# Patient Record
Sex: Female | Born: 1995 | Race: White | Hispanic: No | Marital: Single | Smoking: Never smoker
Health system: Southern US, Community
[De-identification: ages and names within clinical notes are randomized; demographics above are authoritative.]

## PROBLEM LIST (undated history)

## (undated) DIAGNOSIS — I471 Supraventricular tachycardia, unspecified: Secondary | ICD-10-CM

## (undated) DIAGNOSIS — J45909 Unspecified asthma, uncomplicated: Secondary | ICD-10-CM

## (undated) DIAGNOSIS — R569 Unspecified convulsions: Secondary | ICD-10-CM

## (undated) HISTORY — PX: WISDOM TOOTH EXTRACTION: SHX21

## (undated) HISTORY — PX: MYRINGOTOMY WITH TUBE PLACEMENT: SHX5663

## (undated) HISTORY — PX: TONSILLECTOMY: SUR1361

## (undated) HISTORY — PX: LOOP RECORDER IMPLANT: SHX5954

---

## 2016-02-16 ENCOUNTER — Emergency Department (HOSPITAL_BASED_OUTPATIENT_CLINIC_OR_DEPARTMENT_OTHER)
Admission: EM | Admit: 2016-02-16 | Discharge: 2016-02-16 | Disposition: A | Discharge status: Home / Self Care | Payer: Medicaid Other | Attending: Emergency Medicine | Admitting: Emergency Medicine

## 2016-02-16 ENCOUNTER — Emergency Department (HOSPITAL_BASED_OUTPATIENT_CLINIC_OR_DEPARTMENT_OTHER): Payer: Medicaid Other

## 2016-02-16 ENCOUNTER — Encounter (HOSPITAL_BASED_OUTPATIENT_CLINIC_OR_DEPARTMENT_OTHER): Payer: Self-pay

## 2016-02-16 DIAGNOSIS — Y939 Activity, unspecified: Secondary | ICD-10-CM | POA: Insufficient documentation

## 2016-02-16 DIAGNOSIS — Z79899 Other long term (current) drug therapy: Secondary | ICD-10-CM | POA: Insufficient documentation

## 2016-02-16 DIAGNOSIS — W228XXA Striking against or struck by other objects, initial encounter: Secondary | ICD-10-CM | POA: Diagnosis not present

## 2016-02-16 DIAGNOSIS — Y929 Unspecified place or not applicable: Secondary | ICD-10-CM | POA: Insufficient documentation

## 2016-02-16 DIAGNOSIS — Y990 Civilian activity done for income or pay: Secondary | ICD-10-CM | POA: Diagnosis not present

## 2016-02-16 DIAGNOSIS — S6991XA Unspecified injury of right wrist, hand and finger(s), initial encounter: Secondary | ICD-10-CM

## 2016-02-16 NOTE — ED Provider Notes (Signed)
CSN: [id number]     Arrival date & time 02/16/16  2054 History   First MD Initiated Contact with Patient 02/16/16 2254     Chief Complaint  Patient presents with  . Hand Injury     (Consider location/radiation/quality/duration/timing/severity/associated sxs/prior Treatment) HPI Rechelle Niebla is a 20 y.o. female here for evaluation of right hand injury. Patient reports just prior to arrival, patient punched a refrigerator at work because she was angry. She reports sudden onset of throbbing pain in her right hand. Denies any numbness or weakness. No interventions try to improve symptoms. Palpation worsens the discomfort. No other modifying factors.  History reviewed. No pertinent past medical history. Past Surgical History  Procedure Laterality Date  . Tonsillectomy    . Wisdom tooth extraction    . Myringotomy with tube placement     No family history on file. Social History  Substance Use Topics  . Smoking status: Never Smoker   . Smokeless tobacco: None  . Alcohol Use: No   OB History    No data available     Review of Systems A 10 point review of systems was completed and was negative except for pertinent positives and negatives as mentioned in the history of present illness     Allergies  Review of patient's allergies indicates no known allergies.  Home Medications   Prior to Admission medications   Medication Sig Start Date End Date Taking? Authorizing Provider  PROPRANOLOL HCL PO Take by mouth.   Yes Historical Provider, MD  sertraline (ZOLOFT) 25 MG tablet Take 25 mg by mouth daily.   Yes Historical Provider, MD   BP 121/73 mmHg  Pulse 97  Temp(Src) 99 F (37.2 C) (Oral)  Resp 16  Ht 5' (1.524 m)  Wt 54.432 kg  BMI 23.44 kg/m2  SpO2 100%  LMP 02/16/2016 Physical Exam  Constitutional:  Awake, alert, nontoxic appearance.  HENT:  Head: Atraumatic.  Eyes: Right eye exhibits no discharge. Left eye exhibits no discharge.  Neck: Neck supple.   Pulmonary/Chest: Effort normal. She exhibits no tenderness.  Abdominal: Soft. There is no tenderness. There is no rebound.  Musculoskeletal: She exhibits no tenderness.  Baseline ROM, no obvious new focal weakness. Tenderness palpation over her fourth and fifth metacarpals of right hand. Mild ecchymosis on dorsum of hand. Motor strength and sensation are intact and baseline. Pulses intact with brisk cap refill. No wrist pain  Neurological:  Mental status and motor strength appears baseline for patient and situation.  Skin: No rash noted.  Psychiatric: She has a normal mood and affect.  Nursing note and vitals reviewed.   ED Course  Procedures (including critical care time) Labs Review Labs Reviewed - No data to display  Imaging Review Dg Hand Complete Right  02/16/2016  CLINICAL DATA:  Punched a metal door at 17:00. EXAM: RIGHT HAND - COMPLETE 3+ VIEW COMPARISON:  None. FINDINGS: There is no evidence of fracture or dislocation. There is no evidence of arthropathy or other focal bone abnormality. Soft tissues are unremarkable. IMPRESSION: Negative. Electronically Signed   By: Ellery Plunk M.D.   On: 02/16/2016 21:38   I have personally reviewed and evaluated these images and lab results as part of my medical decision-making.   EKG Interpretation None      MDM  Patient X-Ray negative for obvious fracture or dislocation. Pain managed in ED. Pt advised to follow up with orthopedics if symptoms persist for possibility of missed fracture diagnosis. Patient given brace while in  ED, conservative therapy recommended and discussed. Patient will be dc home & is agreeable with above plan.  Final diagnoses:  Hand injury, right, initial encounter        Joycie PeekBenjamin Stephens Shreve, PA-C 02/16/16 2329  Lavera Guiseana Duo Liu, MD 02/17/16 2119

## 2016-02-16 NOTE — ED Notes (Signed)
Punched a refrigerator at worj today-pain right hand-NAD-steady gait

## 2016-02-16 NOTE — Discharge Instructions (Signed)
There is not appear to be an emergent cause for your symptoms at this time. Your exam was reassuring. Your x-rays are negative for any broken bones or dislocations. You may continue taking Tylenol and Motrin for discomfort. Follow-up with your doctor as needed. Return to ED for worsening symptoms.

## 2017-11-21 IMAGING — CR DG HAND COMPLETE 3+V*R*
3 series · 3 of 3 positions shown · non-contrast
Comparison: None.

CLINICAL DATA: Punched a metal door at [DATE].

EXAM:
RIGHT HAND - COMPLETE 3+ VIEW

[x hand pa right]
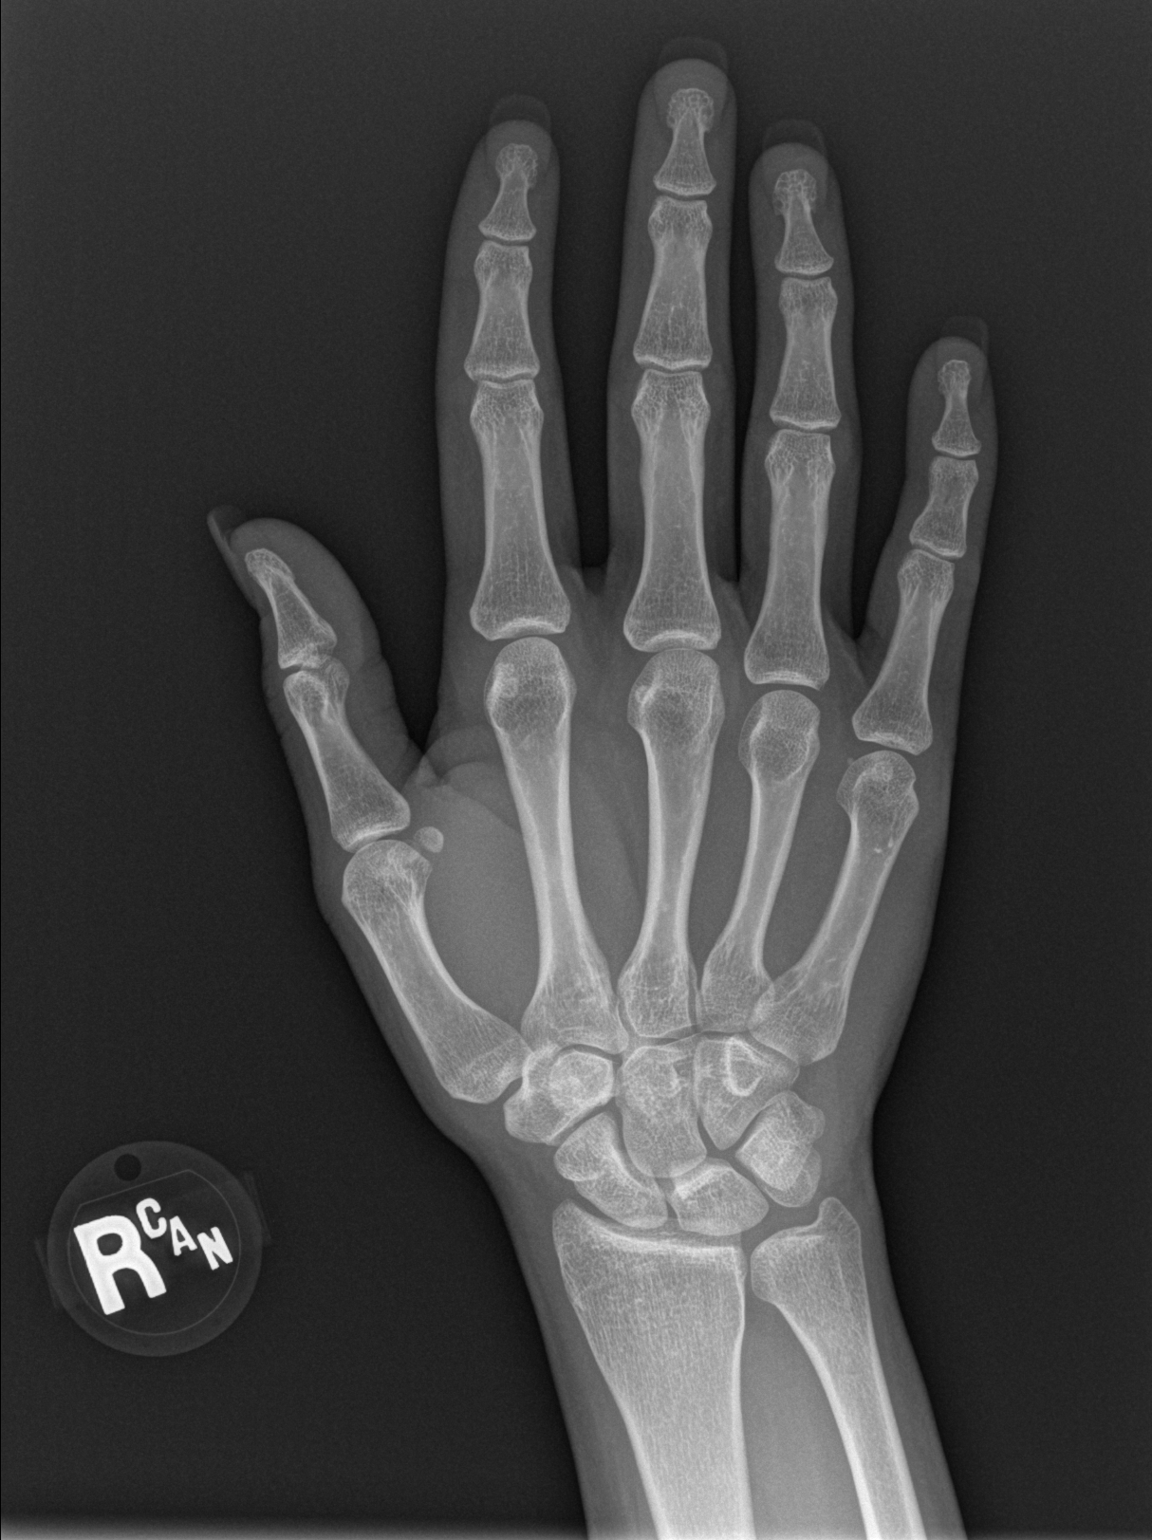

[x hand oblique right]
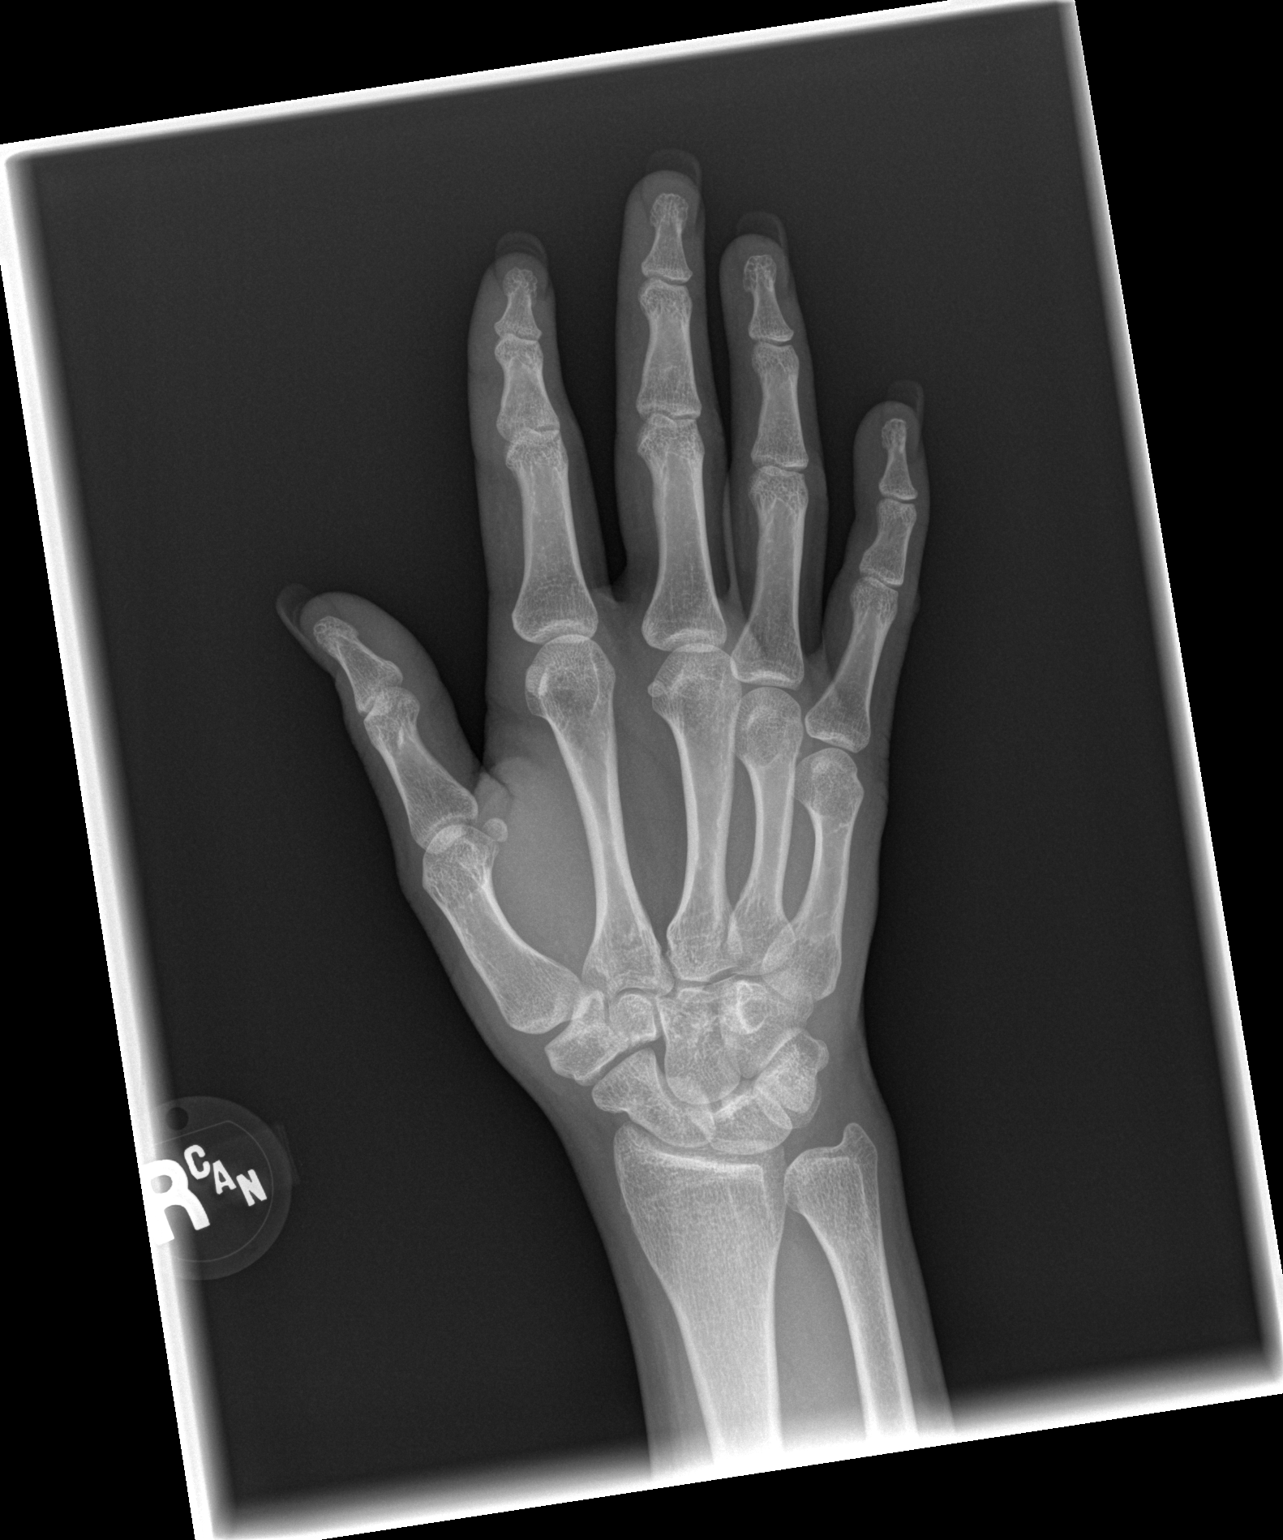

[x hand lat right]
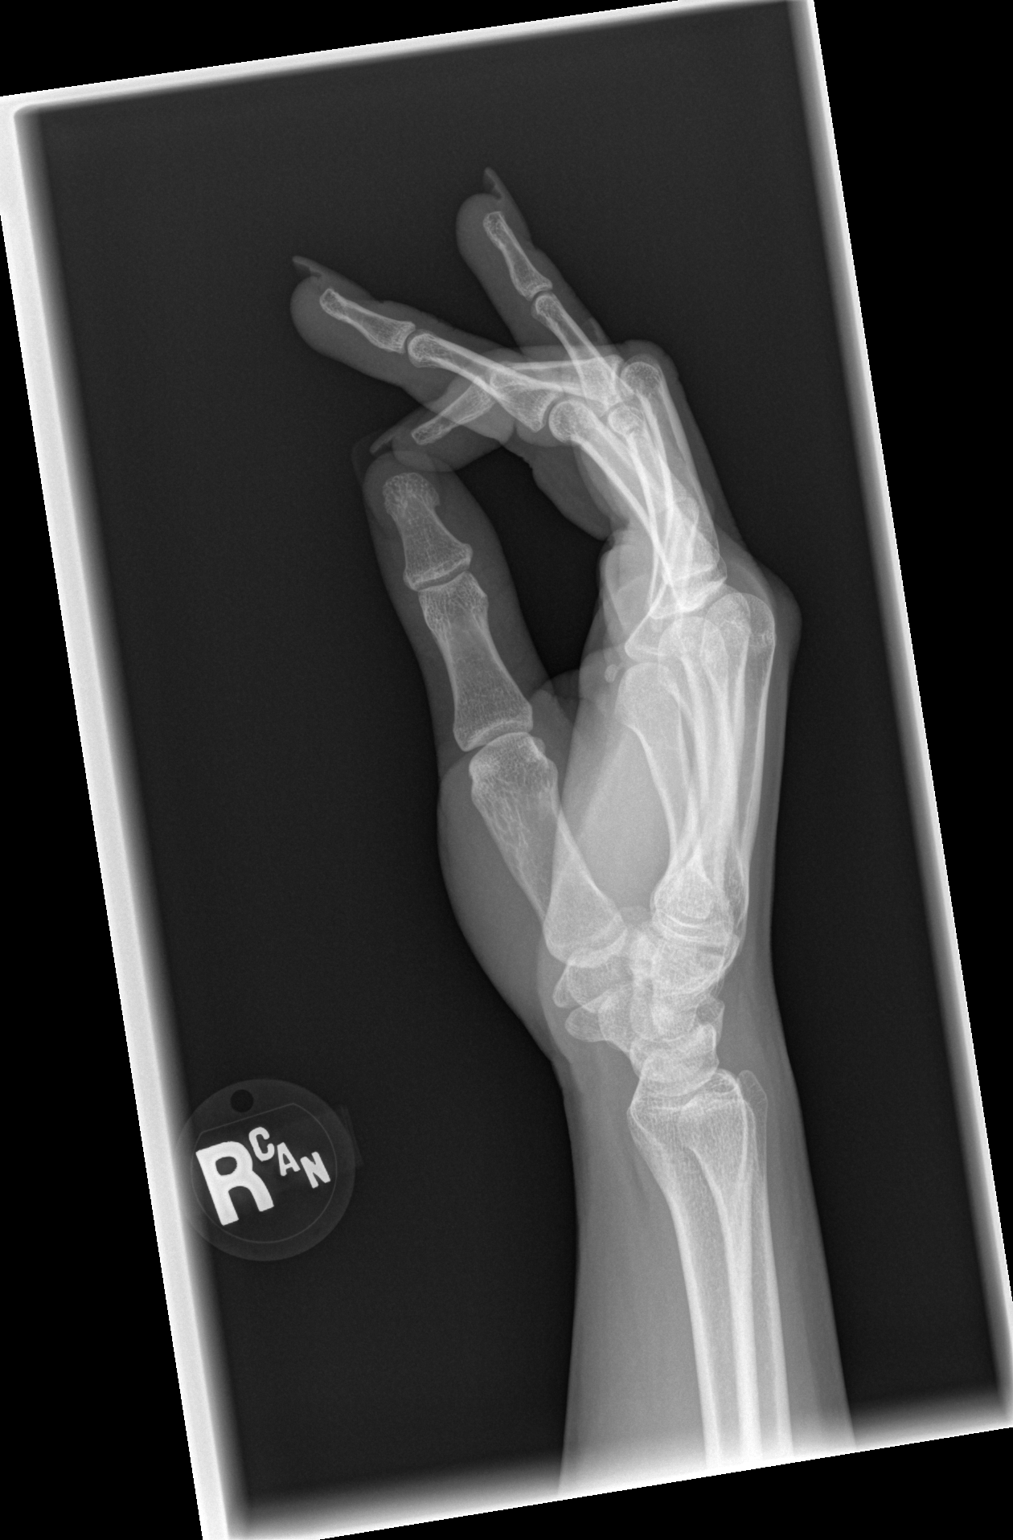

[3 of 3 positions shown; findings below may reference images not displayed]

FINDINGS: There is no evidence of fracture or dislocation. There is no
evidence of arthropathy or other focal bone abnormality. Soft
tissues are unremarkable.
IMPRESSION: Negative.

## 2018-09-14 ENCOUNTER — Encounter (HOSPITAL_COMMUNITY): Payer: Self-pay | Admitting: Emergency Medicine

## 2018-09-14 ENCOUNTER — Ambulatory Visit (HOSPITAL_COMMUNITY)
Admission: EM | Admit: 2018-09-14 | Discharge: 2018-09-14 | Disposition: A | Discharge status: Home / Self Care | Payer: Self-pay

## 2018-09-14 DIAGNOSIS — J00 Acute nasopharyngitis [common cold]: Secondary | ICD-10-CM

## 2018-09-14 DIAGNOSIS — R000 Tachycardia, unspecified: Secondary | ICD-10-CM

## 2018-09-14 HISTORY — DX: Supraventricular tachycardia, unspecified: I47.10

## 2018-09-14 HISTORY — DX: Supraventricular tachycardia: I47.1

## 2018-09-14 MED ORDER — FLUTICASONE PROPIONATE 50 MCG/ACT NA SUSP: 2.0000 | Freq: Every day | NASAL | 0 refills | Status: AC

## 2018-09-14 MED ORDER — IPRATROPIUM BROMIDE 0.06 % NA SOLN: 2.0000 | Freq: Four times a day (QID) | NASAL | 0 refills | Status: AC

## 2018-09-14 NOTE — ED Provider Notes (Signed)
[facility]    CSN: [id number] Arrival date & time: 09/14/18  1540   History   Chief Complaint Chief Complaint  Patient presents with  . Nasal Congestion  . Cough    HPI Nicole Bowman is a 22 y.o. female.   22 year old female comes in for 2 day history of URI symptoms. Cough, congestion, rhinorrhea, body aches, sore throat. No obvious fever, but with chills. States has had some wheezing with talking. Chest tightness, shortness of breath with wheezing.  Denies weakness, dizziness, syncope.  History of asthma as a child, but has not needed albuterol or prednisone in many years.  OTC cold medication without relief.  No obvious sick contact.  Patient denies current palpitations.  No chest pain, but with chest tightness.  She is on oral birth control.  No long travels or history of blood clots. Never smoker.      Past Medical History:  Diagnosis Date  . SVT (supraventricular tachycardia) (HCC)     There are no active problems to display for this patient.   Past Surgical History:  Procedure Laterality Date  . LOOP RECORDER IMPLANT    . MYRINGOTOMY WITH TUBE PLACEMENT    . TONSILLECTOMY    . WISDOM TOOTH EXTRACTION      OB History   None      Home Medications    Prior to Admission medications   Medication Sig Start Date End Date Taking? Authorizing Provider  metoprolol tartrate (LOPRESSOR) 25 MG tablet Take 25 mg by mouth 2 (two) times daily.   Yes [provider]  fluticasone (FLONASE) 50 MCG/ACT nasal spray Place 2 sprays into both nostrils daily. 09/14/18   Cathie HoopsYu, Amy V, PA-C  ipratropium (ATROVENT) 0.06 % nasal spray Place 2 sprays into both nostrils 4 (four) times daily. 09/14/18   Yu, Amy V, PA-C  PROPRANOLOL HCL PO Take by mouth.    [provider]  sertraline (ZOLOFT) 25 MG tablet Take 25 mg by mouth daily.    [provider]    Family History No family history on file.  Social History Social History   Tobacco Use    . Smoking status: Never Smoker  Substance Use Topics  . Alcohol use: No  . Drug use: No     Allergies   Sumatriptan   Review of Systems Review of Systems  Reason unable to perform ROS: See HPI as above.     Physical Exam Triage Vital Signs ED Triage Vitals [09/14/18 1809]  Enc Vitals Group     BP (!) 146/87     Pulse Rate (!) 131     Resp 18     Temp 99.2 F (37.3 C)     Temp src      SpO2 100 %     Weight      Height      Head Circumference      Peak Flow      Pain Score 7     Pain Loc      Pain Edu?      Excl. in GC?    No data found.  Updated Vital Signs BP (!) 146/87   Pulse (!) 131   Temp 99.2 F (37.3 C)   Resp 18   SpO2 100%   Visual Acuity Right Eye Distance:   Left Eye Distance:   Bilateral Distance:    Right Eye Near:   Left Eye Near:    Bilateral Near:  Physical Exam  Constitutional: She is oriented to person, place, and time. She appears well-developed and well-nourished. No distress.  HENT:  Head: Normocephalic and atraumatic.  Right Ear: Tympanic membrane, external ear and ear canal normal. Tympanic membrane is not erythematous and not bulging.  Left Ear: Tympanic membrane, external ear and ear canal normal. Tympanic membrane is not erythematous and not bulging.  Nose: Mucosal edema and rhinorrhea present. Right sinus exhibits no maxillary sinus tenderness and no frontal sinus tenderness. Left sinus exhibits no maxillary sinus tenderness and no frontal sinus tenderness.  Mouth/Throat: Uvula is midline, oropharynx is clear and moist and mucous membranes are normal.  Bilateral nostril nonpatent  Eyes: Pupils are equal, round, and reactive to light. Conjunctivae are normal.  Neck: Normal range of motion. Neck supple.  Cardiovascular: Regular rhythm and normal heart sounds. Tachycardia present. Exam reveals no gallop and no friction rub.  No murmur heard. Pulmonary/Chest: Effort normal and breath sounds normal. No stridor. No  respiratory distress. She has no decreased breath sounds. She has no wheezes. She has no rhonchi. She has no rales.  Speaking in full sentences.   Musculoskeletal: She exhibits no edema.  Lymphadenopathy:    She has no cervical adenopathy.  Neurological: She is alert and oriented to person, place, and time.  Skin: Skin is warm and dry.  Psychiatric: She has a normal mood and affect. Her behavior is normal. Judgment normal.     UC Treatments / Results  Labs (all labs ordered are listed, but only abnormal results are displayed) Labs Reviewed - No data to display  EKG None  Radiology No results found.  Procedures Procedures (including critical care time)  Medications Ordered in UC Medications - No data to display  Initial Impression / Assessment and Plan / UC Course  I have reviewed the triage vital signs and the nursing notes.  Pertinent labs & imaging results that were available during my care of the patient were reviewed by me and considered in my medical decision making (see chart for details).    Discussed case with Dr. Delton See.  Patient tachycardic at 131 without weakness, dizziness, syncope.  Has feelings of chest tightness and shortness of breath, but speaking in full sentences, no respiratory distress, lungs clear to auscultation bilaterally without adventitious lung sounds.  Given history of SVT and current symptoms, will monitor closely for now.  Will have patient discontinue OTC cold medications and push fluids.  Other symptomatic treatment discussed.  Strict return precautions given.  Patient expresses understanding and agrees to plan.  Case discussed with Dr. Delton See, who agrees to plan.  Final Clinical Impressions(s) / UC Diagnoses   Final diagnoses:  Acute nasopharyngitis  Tachycardia    ED Prescriptions    Medication Sig Dispense Auth. Provider   fluticasone (FLONASE) 50 MCG/ACT nasal spray Place 2 sprays into both nostrils daily. 1 g Yu, Amy V, PA-C    ipratropium (ATROVENT) 0.06 % nasal spray Place 2 sprays into both nostrils 4 (four) times daily. 15 mL Threasa Alpha, [place] 09/14/18 1919

## 2018-09-14 NOTE — Discharge Instructions (Signed)
Do not use the over the counter cold medications for now. Start flonase, atrovent nasal spray for nasal congestion/drainage. You can use over the counter nasal saline rinse such as neti pot for nasal congestion. Keep hydrated, your urine should be clear to pale yellow in color. Tylenol/motrin for fever and pain.  If any worsening of chest tightness, shortness of breath, weakness, dizziness, go to the emergency department for further evaluation needed.   For sore throat/cough try using a honey-based tea. Use 3 teaspoons of honey with juice squeezed from half lemon. Place shaved pieces of ginger into 1/2-1 cup of water and warm over stove top. Then mix the ingredients and repeat every 4 hours as needed.

## 2019-01-03 ENCOUNTER — Ambulatory Visit (HOSPITAL_COMMUNITY)
Admission: EM | Admit: 2019-01-03 | Discharge: 2019-01-03 | Disposition: A | Discharge status: Home / Self Care | Payer: Medicaid Other | Attending: Family Medicine | Admitting: Family Medicine

## 2019-01-03 ENCOUNTER — Other Ambulatory Visit: Payer: Self-pay

## 2019-01-03 DIAGNOSIS — M79641 Pain in right hand: Secondary | ICD-10-CM

## 2019-01-03 DIAGNOSIS — M654 Radial styloid tenosynovitis [de Quervain]: Secondary | ICD-10-CM

## 2019-01-03 DIAGNOSIS — M25531 Pain in right wrist: Secondary | ICD-10-CM

## 2019-01-03 MED ORDER — PREDNISONE 10 MG (21) PO TBPK: ORAL_TABLET | ORAL | 0 refills | Status: DC

## 2019-01-03 NOTE — ED Provider Notes (Signed)
[facility]    CSN: [id number] Arrival date & time: 01/03/19  1100     History   Chief Complaint Chief Complaint  Patient presents with  . Hand Pain    HPI Nicole Bowman is a 23 y.o. female.   Pt is a 23 year old female that presents with right palmer hand pain with radiation into the right wrist. Mild swelling. Symptoms have been constant and worsening over the past 3 days. She has been taking ibuprofen with minimal relief. Denies any injuries. Previous job was Engineering geologist. No fever, chills.  No numbness or tingling.  ROS per HPI      Past Medical History:  Diagnosis Date  . SVT (supraventricular tachycardia) (HCC)     There are no active problems to display for this patient.   Past Surgical History:  Procedure Laterality Date  . LOOP RECORDER IMPLANT    . MYRINGOTOMY WITH TUBE PLACEMENT    . TONSILLECTOMY    . WISDOM TOOTH EXTRACTION      OB History   No obstetric history on file.      Home Medications    Prior to Admission medications   Medication Sig Start Date End Date Taking? Authorizing Provider  fluticasone (FLONASE) 50 MCG/ACT nasal spray Place 2 sprays into both nostrils daily. 09/14/18   Cathie Hoops, Amy V, PA-C  ipratropium (ATROVENT) 0.06 % nasal spray Place 2 sprays into both nostrils 4 (four) times daily. 09/14/18   Cathie Hoops, Amy V, PA-C  metoprolol tartrate (LOPRESSOR) 25 MG tablet Take 25 mg by mouth 2 (two) times daily.    [provider]  predniSONE (STERAPRED UNI-PAK 21 TAB) 10 MG (21) TBPK tablet 6 tabs for 1 day, then 5 tabs for 1 das, then 4 tabs for 1 day, then 3 tabs for 1 day, 2 tabs for 1 day, then 1 tab for 1 day 01/03/19   Janace Aris, NP    Family History No family history on file.  Social History Social History   Tobacco Use  . Smoking status: Never Smoker  Substance Use Topics  . Alcohol use: No  . Drug use: No     Allergies   Sumatriptan   Review of Systems Review of Systems   Physical Exam Triage  Vital Signs ED Triage Vitals  Enc Vitals Group     BP 01/03/19 1113 107/63     Pulse Rate 01/03/19 1113 87     Resp 01/03/19 1113 16     Temp 01/03/19 1113 98.4 F (36.9 C)     Temp Source 01/03/19 1113 Oral     SpO2 01/03/19 1113 100 %     Weight --      Height 01/03/19 1116 5\' 1"  (1.549 m)     Head Circumference --      Peak Flow --      Pain Score 01/03/19 1116 7     Pain Loc --      Pain Edu? --      Excl. in GC? --    No data found.  Updated Vital Signs BP 107/63 (BP Location: Right Arm)   Pulse 87   Temp 98.4 F (36.9 C) (Oral)   Resp 16   Ht 5\' 1"  (1.549 m)   LMP 12/23/2018   SpO2 100%   BMI 22.67 kg/m   Visual Acuity Right Eye Distance:   Left Eye Distance:   Bilateral Distance:    Right Eye Near:   Left Eye Near:  Bilateral Near:     Physical Exam Vitals signs and nursing note reviewed.  Constitutional:      General: She is not in acute distress.    Appearance: Normal appearance. She is not ill-appearing, toxic-appearing or diaphoretic.  HENT:     Head: Normocephalic and atraumatic.     Nose: Nose normal.  Eyes:     Conjunctiva/sclera: Conjunctivae normal.  Neck:     Musculoskeletal: Normal range of motion.  Pulmonary:     Effort: Pulmonary effort is normal.  Musculoskeletal: Normal range of motion.        General: Swelling and tenderness present. No deformity or signs of injury.     Comments: Tenderness and swelling to the right palm.  Some pain with flexion extension of the right thumb.  No erythema or bruising.  No deformities. Positive Finkelstein  Skin:    General: Skin is warm and dry.     Findings: No rash.  Neurological:     Mental Status: She is alert.  Psychiatric:        Mood and Affect: Mood normal.      UC Treatments / Results  Labs (all labs ordered are listed, but only abnormal results are displayed) Labs Reviewed - No data to display  EKG None  Radiology No results found.  Procedures Procedures (including  critical care time)  Medications Ordered in UC Medications - No data to display  Initial Impression / Assessment and Plan / UC Course  I have reviewed the triage vital signs and the nursing notes.  Pertinent labs & imaging results that were available during my care of the patient were reviewed by me and considered in my medical decision making (see chart for details).     Symptoms consistent with de Quervain's tendinitis We will treat with prednisone taper over the next 6 days and give her thumb spica splint to wear Follow up as needed for continued or worsening symptoms  Final Clinical Impressions(s) / UC Diagnoses   Final diagnoses:  Tendinitis, de Quervain's     Discharge Instructions     I believe your symptoms are related to tendinitis We will place you in a splint here and have you start taking prednisone daily for the next 6 days.  Make sure you take this with food. You can still continue to use ice Follow up as needed for continued or worsening symptoms     ED Prescriptions    Medication Sig Dispense Auth. Provider   predniSONE (STERAPRED UNI-PAK 21 TAB) 10 MG (21) TBPK tablet 6 tabs for 1 day, then 5 tabs for 1 das, then 4 tabs for 1 day, then 3 tabs for 1 day, 2 tabs for 1 day, then 1 tab for 1 day 21 tablet Jaci Lazier, Erin Uecker A, NP     Controlled Substance Prescriptions [place] Controlled Substance Registry consulted? Not Applicable   Janace Aris, NP 01/03/19 1151

## 2019-01-03 NOTE — ED Triage Notes (Signed)
Per pt she has been having swelling in her right hand for a bout 3 to 4 days now. Woke up this morning and the pain is running doe=wnt the inside of her wrist. No obvious swelling noted or redness. No injury.

## 2019-01-03 NOTE — Discharge Instructions (Signed)
I believe your symptoms are related to tendinitis We will place you in a splint here and have you start taking prednisone daily for the next 6 days.  Make sure you take this with food. You can still continue to use ice Follow up as needed for continued or worsening symptoms

## 2019-03-31 ENCOUNTER — Encounter (HOSPITAL_COMMUNITY): Payer: Self-pay | Admitting: *Deleted

## 2019-03-31 ENCOUNTER — Other Ambulatory Visit: Payer: Self-pay

## 2019-03-31 ENCOUNTER — Ambulatory Visit (INDEPENDENT_AMBULATORY_CARE_PROVIDER_SITE_OTHER): Payer: Self-pay

## 2019-03-31 ENCOUNTER — Ambulatory Visit (HOSPITAL_COMMUNITY)
Admission: EM | Admit: 2019-03-31 | Discharge: 2019-03-31 | Disposition: A | Discharge status: Home / Self Care | Payer: Self-pay | Attending: Family Medicine | Admitting: Family Medicine

## 2019-03-31 DIAGNOSIS — R0781 Pleurodynia: Secondary | ICD-10-CM

## 2019-03-31 DIAGNOSIS — R05 Cough: Secondary | ICD-10-CM

## 2019-03-31 DIAGNOSIS — R062 Wheezing: Secondary | ICD-10-CM

## 2019-03-31 DIAGNOSIS — R059 Cough, unspecified: Secondary | ICD-10-CM

## 2019-03-31 HISTORY — DX: Unspecified convulsions: R56.9

## 2019-03-31 HISTORY — DX: Unspecified asthma, uncomplicated: J45.909

## 2019-03-31 MED ORDER — PREDNISONE 10 MG (21) PO TBPK: ORAL_TABLET | Freq: Every day | ORAL | 0 refills | Status: AC

## 2019-03-31 NOTE — ED Triage Notes (Addendum)
Denies injury.  Reports right lateral and posterior ribcage tenderness x 4 days.  C/O painful movements.  Denies any abd or flank pain.  Pt mentions that she has also had a productive cough.  Denies fevers.

## 2019-03-31 NOTE — ED Provider Notes (Signed)
[facility]   [id number] 03/31/19 Arrival Time: [id number]  ASSESSMENT & PLAN:  1. Cough   2. Rib pain on right side   3. Wheezing    I have personally viewed the imaging studies ordered this visit. No infiltrates. No pneumothorax.  Trial of: Meds ordered this encounter  Medications  . predniSONE (STERAPRED UNI-PAK 21 TAB) 10 MG (21) TBPK tablet    Sig: Take by mouth daily. Take as directed.    Dispense:  21 tablet    Refill:  0   Has albuterol inhaler to use prn at home. OTC as needed. Ibuprofen for rib soreness, likely from coughing.  May f/u with PCP or here as needed.  Reviewed expectations re: course of current medical issues. Questions answered. Outlined signs and symptoms indicating need for more acute intervention. Patient verbalized understanding. After Visit Summary given.   SUBJECTIVE: History from: patient.  Nicole Bowman is a 23 y.o. female who presents with complaint of a non-productive cough. Gradual onset beginning approx 4-5 d ago. Questions mild wheezing at times. H/O asthma. Has not used inhaler much secondary to h/o SVT. No SOB. Cough worse at night. No fever reported. Mild fatigue. No body aches. Overall normal PO intake without n/v. Known sick contacts: no. No specific or significant aggravating or alleviating factors reported. No chest wall injury/trauma. No rashes noted. No known COVID-19 exposure. OTC treatment: none reported.  Social History   Tobacco Use  Smoking Status Never Smoker  Smokeless Tobacco Never Used   ROS: As per HPI. All other systems negative.  OBJECTIVE:  Vitals:   03/31/19 1012  BP: 96/60  Pulse: 87  Resp: 16  Temp: 99 F (37.2 C)  TempSrc: Oral  SpO2: 99%     General appearance: alert; NAD HEENT: mild nasal congestion; nose clear; mild throat irritation Neck: supple without LAD CV: RRR Lungs: unlabored respirations, symmetrical air entry with mild bilateral expiratory wheezing; cough: mild and dry;  able to speak full sentences without difficulty Abd: soft Ext: no LE edema Skin: warm and dry Psychological: alert and cooperative; normal mood and affect  Imaging: Dg Chest 2 View  Result Date: 03/31/2019 CLINICAL DATA:  Productive cough. EXAM: CHEST - 2 VIEW COMPARISON:  No prior. FINDINGS: Mediastinum hilar structures normal. Cardiac monitor device noted. Heart size normal. No focal infiltrate. No pleural effusion or pneumothorax. IMPRESSION: No acute cardiopulmonary disease. Electronically Signed   By: Maisie Fushomas  Register   On: 03/31/2019 10:45    Allergies  Allergen Reactions  . Sumatriptan Palpitations    Past Medical History:  Diagnosis Date  . Asthma   . Seizures (HCC)   . SVT (supraventricular tachycardia) (HCC)    Family History  Problem Relation Age of Onset  . HIV Mother   . Depression Mother   . Diabetes Father   . Depression Father   . Anxiety disorder Father    Social History   Socioeconomic History  . Marital status: Single    Spouse name: Not on file  . Number of children: Not on file  . Years of education: Not on file  . Highest education level: Not on file  Occupational History  . Not on file  Social Needs  . Financial resource strain: Not on file  . Food insecurity    Worry: Not on file    Inability: Not on file  . Transportation needs    Medical: Not on file    Non-medical: Not on file  Tobacco Use  .  Smoking status: Never Smoker  . Smokeless tobacco: Never Used  Substance and Sexual Activity  . Alcohol use: No  . Drug use: No  . Sexual activity: Not on file  Lifestyle  . Physical activity    Days per week: Not on file    Minutes per session: Not on file  . Stress: Not on file  Relationships  . Social Herbalist on phone: Not on file    Gets together: Not on file    Attends religious service: Not on file    Active member of club or organization: Not on file    Attends meetings of clubs or organizations: Not on file     Relationship status: Not on file  . Intimate partner violence    Fear of current or ex partner: Not on file    Emotionally abused: Not on file    Physically abused: Not on file    Forced sexual activity: Not on file  Other Topics Concern  . Not on file  Social History Narrative  . Not on file           Vanessa Kick, MD 04/02/19 [phone]

## 2021-01-03 IMAGING — DX CHEST - 2 VIEW
2 series · 2 of 2 positions shown · non-contrast
Comparison: No prior.

CLINICAL DATA: Productive cough.

EXAM:
CHEST - 2 VIEW

[chest pa]
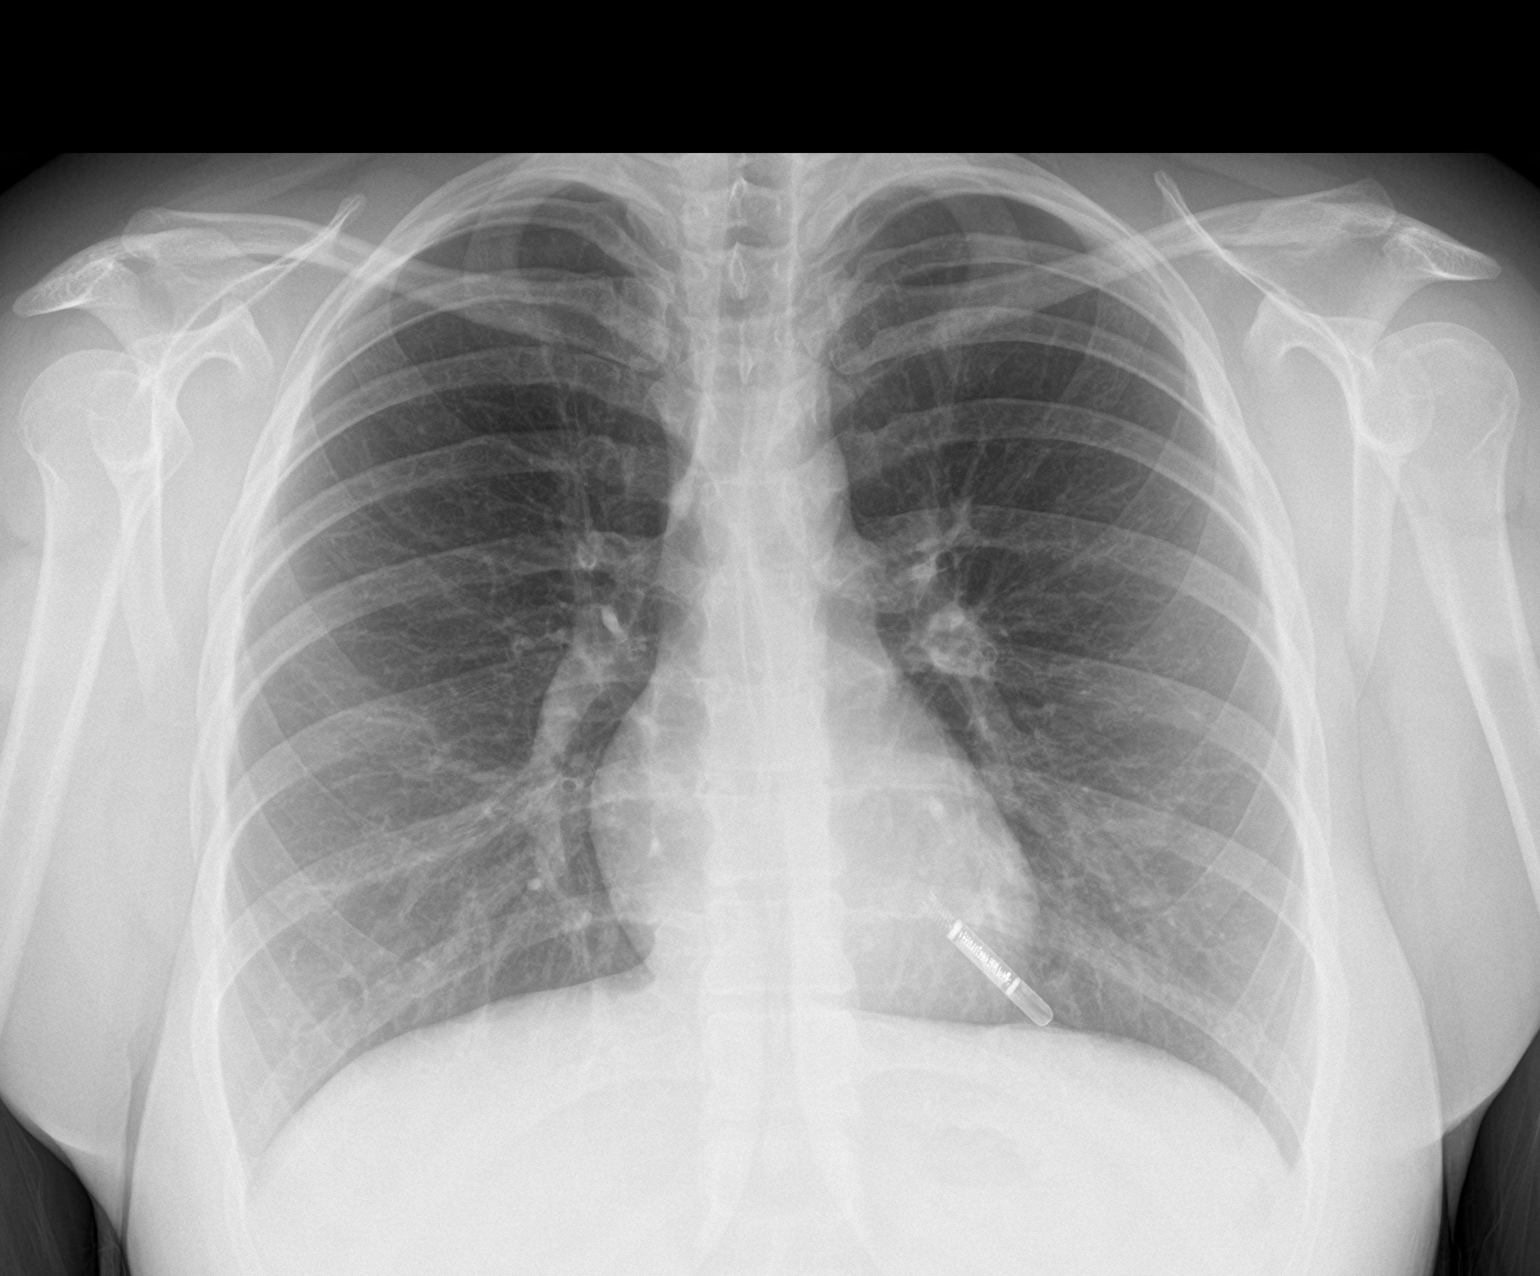

[chest lat]
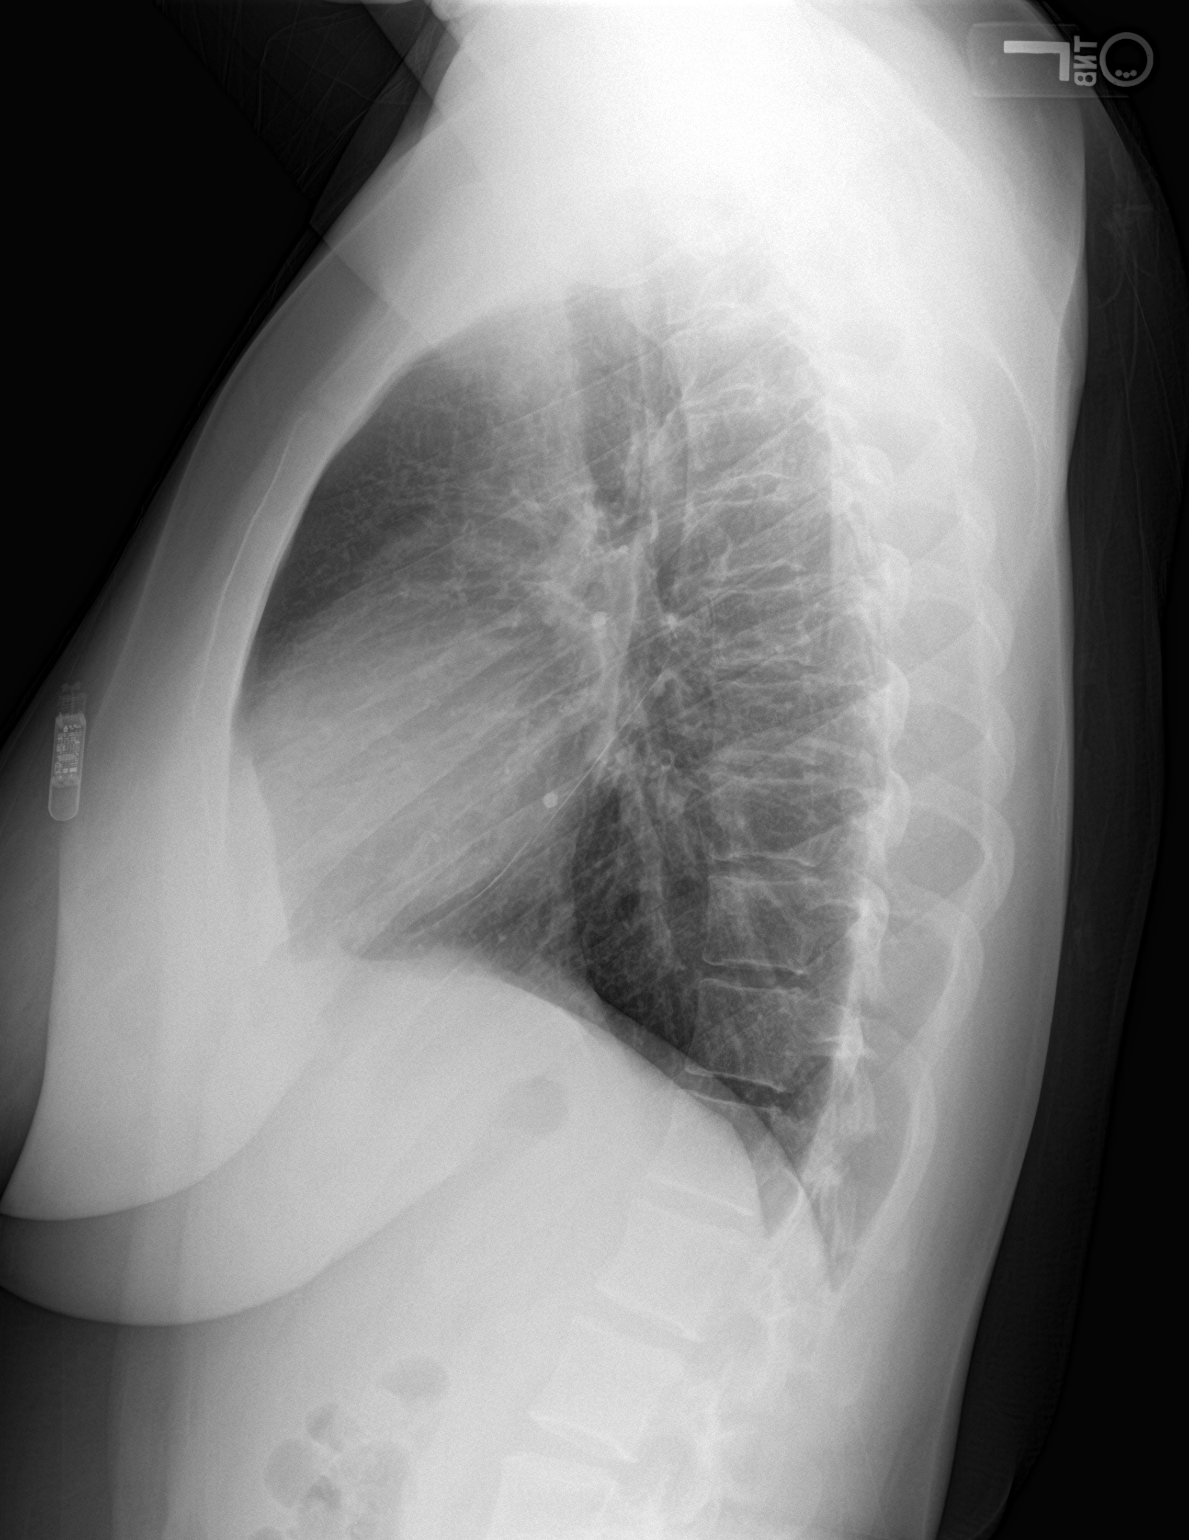

[2 of 2 positions shown; findings below may reference images not displayed]

FINDINGS: Mediastinum hilar structures normal. Cardiac monitor device noted.
Heart size normal. No focal infiltrate. No pleural effusion or
pneumothorax.
IMPRESSION: No acute cardiopulmonary disease.
